# Patient Record
Sex: Male | Born: 1985 | Race: Asian | Hispanic: No | Marital: Single | Smoking: Current some day smoker
Health system: Southern US, Community
[De-identification: ages and names within clinical notes are randomized; demographics above are authoritative.]

## PROBLEM LIST (undated history)

## (undated) DIAGNOSIS — S43006A Unspecified dislocation of unspecified shoulder joint, initial encounter: Secondary | ICD-10-CM

---

## 2010-07-05 ENCOUNTER — Emergency Department (HOSPITAL_COMMUNITY)
Admission: EM | Admit: 2010-07-05 | Discharge: 2010-07-05 | Disposition: A | Discharge status: Home / Self Care | Payer: Self-pay | Attending: Emergency Medicine | Admitting: Emergency Medicine

## 2010-07-05 DIAGNOSIS — R51 Headache: Secondary | ICD-10-CM | POA: Insufficient documentation

## 2010-07-05 DIAGNOSIS — F17200 Nicotine dependence, unspecified, uncomplicated: Secondary | ICD-10-CM | POA: Insufficient documentation

## 2011-06-09 ENCOUNTER — Encounter: Payer: Self-pay | Admitting: Internal Medicine

## 2011-06-09 ENCOUNTER — Ambulatory Visit (INDEPENDENT_AMBULATORY_CARE_PROVIDER_SITE_OTHER): Payer: Self-pay | Admitting: Internal Medicine

## 2011-06-09 DIAGNOSIS — M25519 Pain in unspecified shoulder: Secondary | ICD-10-CM

## 2011-06-09 DIAGNOSIS — M25561 Pain in right knee: Secondary | ICD-10-CM | POA: Insufficient documentation

## 2011-06-09 DIAGNOSIS — M25569 Pain in unspecified knee: Secondary | ICD-10-CM

## 2011-06-09 DIAGNOSIS — M25511 Pain in right shoulder: Secondary | ICD-10-CM

## 2011-06-09 HISTORY — PX: NO PAST SURGERIES: SHX2092

## 2011-06-09 MED ORDER — MELOXICAM 7.5 MG PO TABS: ORAL_TABLET | ORAL | Status: AC
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
## 2011-06-09 NOTE — Assessment & Plan Note (Signed)
?  h/o mild dislocation now with FROM. Attempt mobic prn with food and no other nsaids. Encouraged orthopedic follow up as scheduled.

## 2011-06-09 NOTE — Assessment & Plan Note (Signed)
Resolved. Reportedly nl radiograph

## 2011-06-13 ENCOUNTER — Telehealth: Payer: Self-pay | Admitting: *Deleted

## 2011-06-13 NOTE — Telephone Encounter (Signed)
Patients mother called and left voice message stating patient is scheduled for Shoulder therapy appointment at 3 pm today and she is planning on cancelling appointment. Her message also states patient was given rx for his shoulder, and his shoulder is still inflamed.  Call was returned to patient. I spoke with patients mother, who stated she cancelled his PT appointment today, because he was given a rx for an inflamed shoulder.  Patients mother was informed the medication was short term and patient should follow through with physical therapy. She stated that he will keep the appointment scheduled for Thursday.

## 2012-10-06 ENCOUNTER — Emergency Department (HOSPITAL_COMMUNITY): Payer: Self-pay

## 2012-10-06 ENCOUNTER — Encounter (HOSPITAL_BASED_OUTPATIENT_CLINIC_OR_DEPARTMENT_OTHER): Payer: Self-pay | Admitting: *Deleted

## 2012-10-06 ENCOUNTER — Emergency Department (HOSPITAL_BASED_OUTPATIENT_CLINIC_OR_DEPARTMENT_OTHER): Payer: Self-pay

## 2012-10-06 ENCOUNTER — Emergency Department (HOSPITAL_BASED_OUTPATIENT_CLINIC_OR_DEPARTMENT_OTHER)
Admission: EM | Admit: 2012-10-06 | Discharge: 2012-10-06 | Disposition: A | Discharge status: Home / Self Care | Payer: Self-pay | Attending: Orthopedic Surgery | Admitting: Orthopedic Surgery

## 2012-10-06 DIAGNOSIS — Y92009 Unspecified place in unspecified non-institutional (private) residence as the place of occurrence of the external cause: Secondary | ICD-10-CM | POA: Insufficient documentation

## 2012-10-06 DIAGNOSIS — Y9389 Activity, other specified: Secondary | ICD-10-CM | POA: Insufficient documentation

## 2012-10-06 DIAGNOSIS — S43006A Unspecified dislocation of unspecified shoulder joint, initial encounter: Secondary | ICD-10-CM | POA: Insufficient documentation

## 2012-10-06 DIAGNOSIS — F17200 Nicotine dependence, unspecified, uncomplicated: Secondary | ICD-10-CM | POA: Insufficient documentation

## 2012-10-06 DIAGNOSIS — IMO0002 Reserved for concepts with insufficient information to code with codable children: Secondary | ICD-10-CM | POA: Insufficient documentation

## 2012-10-06 DIAGNOSIS — S43004A Unspecified dislocation of right shoulder joint, initial encounter: Secondary | ICD-10-CM

## 2012-10-06 HISTORY — DX: Unspecified dislocation of unspecified shoulder joint, initial encounter: S43.006A

## 2012-10-06 MED ORDER — PROPOFOL 10 MG/ML IV BOLUS
0.5000 mg/kg | Freq: Once | INTRAVENOUS | Status: AC
  Administered 2012-10-06: 40 mg via INTRAVENOUS

## 2012-10-06 MED ORDER — ONDANSETRON HCL 4 MG/2ML IJ SOLN
INTRAMUSCULAR | Status: AC
  Administered 2012-10-06: 4 mg
  Filled 2012-10-06: qty 2

## 2012-10-06 MED ORDER — HYDROCODONE-ACETAMINOPHEN 5-325 MG PO TABS: 1.0000 | ORAL_TABLET | Freq: Four times a day (QID) | ORAL | Status: AC | PRN

## 2012-10-06 MED ORDER — LORAZEPAM 2 MG/ML IJ SOLN
INTRAMUSCULAR | Status: AC
  Administered 2012-10-06: 1 mg
  Filled 2012-10-06: qty 1

## 2012-10-06 MED ORDER — PROPOFOL 10 MG/ML IV BOLUS
0.5000 mg/kg | Freq: Once | INTRAVENOUS | Status: AC
  Administered 2012-10-06: 70 mg via INTRAVENOUS

## 2012-10-06 MED ORDER — FENTANYL CITRATE 0.05 MG/ML IJ SOLN
INTRAMUSCULAR | Status: AC
  Administered 2012-10-06: 200 ug
  Filled 2012-10-06: qty 4

## 2012-10-06 MED ORDER — FENTANYL CITRATE 0.05 MG/ML IJ SOLN
INTRAMUSCULAR | Status: AC
  Administered 2012-10-06: 100 ug
  Filled 2012-10-06: qty 2

## 2012-10-06 MED ORDER — PROPOFOL 10 MG/ML IV BOLUS
INTRAVENOUS | Status: AC
  Filled 2012-10-06: qty 20

## 2012-10-06 MED ORDER — PROPOFOL 10 MG/ML IV BOLUS
INTRAVENOUS | Status: AC
  Administered 2012-10-06: 40 mg via INTRAVENOUS
  Filled 2012-10-06: qty 20

## 2012-10-06 MED ORDER — MORPHINE SULFATE 4 MG/ML IJ SOLN
4.0000 mg | Freq: Once | INTRAMUSCULAR | Status: AC
  Administered 2012-10-06: 4 mg via INTRAVENOUS
  Filled 2012-10-06: qty 1

## 2012-10-06 NOTE — ED Notes (Signed)
Pt c/o right shoulder pain and deformity, pt has hx os shoulder dislocation in the past.

## 2012-10-06 NOTE — ED Notes (Signed)
Dr. Ave Filter at bedside. Right shoulder reduced without difficulty. Tolerated well. NAD. VSS

## 2012-10-06 NOTE — ED Notes (Signed)
Consent obtained for conscious sedation, pt and family agree with plan of care.

## 2012-10-06 NOTE — ED Notes (Signed)
Dr. Ave Filter made aware pt arrived to [facility], in 9/10 pain. EDP made aware as well.

## 2012-10-06 NOTE — ED Notes (Signed)
Report received, assumed care.  

## 2012-10-06 NOTE — ED Notes (Signed)
Dr Nicanor Alcon at bs for shoulder reduction.

## 2012-10-06 NOTE — ED Notes (Signed)
Pt remains on NRB at this time, MD at bs, resps even and unlabored.

## 2012-10-06 NOTE — ED Provider Notes (Signed)
History     CSN: [id number]  Arrival date & time 10/06/12  0214   First MD Initiated Contact with Patient 10/06/12 0216      Chief Complaint  Patient presents with  . Shoulder Injury    (Consider location/radiation/quality/duration/timing/severity/associated sxs/prior treatment) Patient is a 27 y.o. male presenting with shoulder injury. The history is provided by the patient and a relative.  Shoulder Injury Pertinent negatives include no chest pain, no abdominal pain, no headaches and no shortness of breath.  pt getting into bed last pm, noted sudden onset pain/deformity to right shoulder. Hx same?prior dislocation. Constant. Dull, moderate-severe pain to shoulder. Denies other injury. No numbness/weakness. No neck or back pain. Went to [facility] ED, MD there unable to reduce shoulder - pt sent to [facility] ED for ortho to see/reduce - Dr Ave Filter consulted by [facility] ED provider.    Past Medical History  Diagnosis Date  . Shoulder dislocation     Past Surgical History  Procedure Laterality Date  . No past surgeries  06/09/2011    Family History  Problem Relation Age of Onset  . Heart disease Neg Hx   . Diabetes Neg Hx   . Hypertension Mother   . Prostate cancer Neg Hx   . Breast cancer Neg Hx     History  Substance Use Topics  . Smoking status: Current Some Day Smoker  . Smokeless tobacco: Never Used  . Alcohol Use: Yes      Review of Systems  Constitutional: Negative for fever.  HENT: Negative for neck pain.   Eyes: Negative for pain.  Respiratory: Negative for shortness of breath.   Cardiovascular: Negative for chest pain.  Gastrointestinal: Negative for abdominal pain.  Genitourinary: Negative for flank pain.  Musculoskeletal: Negative for back pain.  Skin: Negative for wound.  Neurological: Negative for weakness, numbness and headaches.  Hematological: Does not bruise/bleed easily.  Psychiatric/Behavioral: The patient is not nervous/anxious.     Allergies   Review of patient's allergies indicates no known allergies.  Home Medications   Current Outpatient Rx  Name  Route  Sig  Dispense  Refill  . acetaminophen (TYLENOL) 325 MG tablet   Oral   Take 650 mg by mouth every 6 (six) hours as needed for pain.           BP 120/69  Pulse 86  Temp(Src) 98.6 F (37 C) (Oral)  Resp 17  SpO2 99%  Physical Exam  Nursing note and vitals reviewed. Constitutional: He appears well-developed and well-nourished. No distress.  HENT:  Head: Atraumatic.  Eyes: Pupils are equal, round, and reactive to light.  Neck: Normal range of motion. Neck supple. No tracheal deviation present.  Cardiovascular: Normal rate.   Pulmonary/Chest: Effort normal and breath sounds normal. No accessory muscle usage. No respiratory distress. He exhibits no tenderness.  Abdominal: Soft. Bowel sounds are normal. He exhibits no distension. There is no tenderness.  Musculoskeletal: Normal range of motion.  CTLS spine, non tender, aligned, no step off. Right shoulder deformity, c/w disloc right shoulder. Distal pulses palp. No other focal bony tenderness  Neurological: He is alert.  Motor intact bil.   Skin: Skin is warm and dry.  Psychiatric: He has a normal mood and affect.    ED Course  Procedures (including critical care time)     MDM  Iv ns.   Informed consent by pt/family re procedural sedation and reduction shoulder dislocation.  Preprocedure  Pre-anesthesia/induction confirmation of laterality/correct procedure site including "time-out."  Provider confirms review of the nurses' note, allergies, medications, pertinent labs, PMH, pre-induction vital signs, pulse oximetry, pain level, and ECG (as applicable), and patient condition satisfactory for commencing with order for sedation and procedure.  Procedural sedation Performed by: Suzi Roots Consent: Verbal consent obtained. Risks and benefits: risks, benefits and alternatives were discussed Required  items: required blood products, implants, devices, and special equipment available Patient identity confirmed: arm band and provided demographic data Time out: Immediately prior to procedure a "time out" was called to verify the correct patient, procedure, equipment, support staff and site/side marked as required.  Sedation type: moderate (conscious) sedation NPO time confirmed and considedered  Sedatives: PROPOFOL  Physician Time at Bedside: 20 min  Vitals: Vital signs were monitored during sedation. Cardiac Monitor, pulse oximeter Patient tolerance: Patient tolerated the procedure well with no immediate complications. Comments: Pt with uneventful recovered. Returned to pre-procedural sedation baseline   Total of 80 mg propofol used, sedation excellent. Pt pulse ox maintained > 98% throughout procedure/recovery. Pt regained full/normal level of alertness post procedure.   Shoulder dislocation reduced by orthopedist, Dr Ave Filter.  Post reduction films reviewed.  Recheck radial pulse 2+. Pt comfortable.  No numbness/weakness. R/m/u n fxn intact.   Spine nt on recheck.  No new c/o.  Pt comfortable. Appears stable for d/c.         Suzi Roots, MD 10/06/12 [phone]

## 2012-10-06 NOTE — ED Notes (Signed)
MD at bedside, reason for possible transfer explained to family and agrees with plan of care.

## 2012-10-06 NOTE — ED Notes (Signed)
PCXR at bedside.

## 2012-10-06 NOTE — ED Notes (Signed)
Attempt x 2 by Dr Nicanor Alcon for shoulder reduction without success.
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
## 2012-10-06 NOTE — ED Notes (Signed)
Pt placed in arm sling and positioned for comfort for transfer. Pt and family aware of plan of care. IV site unremarkable.

## 2012-10-06 NOTE — ED Notes (Signed)
MD at bedside. 

## 2012-10-06 NOTE — ED Notes (Signed)
MD, Dr. Ave Filter at bedside.

## 2012-10-06 NOTE — ED Notes (Signed)
Patient transported to X-ray 

## 2012-10-06 NOTE — ED Notes (Signed)
Pt report given to Franky Macho, Charity fundraiser with [facility].

## 2012-10-06 NOTE — ED Provider Notes (Signed)
History     CSN: [id number]  Arrival date & time 10/06/12  0214   First MD Initiated Contact with Patient 10/06/12 0216      Chief Complaint  Patient presents with  . Shoulder Injury    (Consider location/radiation/quality/duration/timing/severity/associated sxs/prior treatment) Patient is a 27 y.o. male presenting with shoulder injury. The history is provided by the patient and the EMS personnel. No language interpreter was used.  Shoulder Injury This is a recurrent problem. The current episode started 6 to 12 hours ago. The problem occurs constantly. The problem has not changed since onset.Pertinent negatives include no chest pain, no abdominal pain, no headaches and no shortness of breath. Nothing aggravates the symptoms. Nothing relieves the symptoms. He has tried nothing for the symptoms. The treatment provided no relief.    Past Medical History  Diagnosis Date  . Shoulder dislocation     Past Surgical History  Procedure Laterality Date  . No past surgeries  06/09/2011    Family History  Problem Relation Age of Onset  . Heart disease Neg Hx   . Diabetes Neg Hx   . Hypertension Mother   . Prostate cancer Neg Hx   . Breast cancer Neg Hx     History  Substance Use Topics  . Smoking status: Current Some Day Smoker  . Smokeless tobacco: Never Used  . Alcohol Use: Yes      Review of Systems  Respiratory: Negative for shortness of breath.   Cardiovascular: Negative for chest pain.  Gastrointestinal: Negative for abdominal pain.  Neurological: Negative for headaches.  All other systems reviewed and are negative.    Allergies  Review of patient's allergies indicates not on file.  Home Medications  No current outpatient prescriptions on file.  BP 125/82  Pulse 70  Temp(Src) 98.1 F (36.7 C) (Oral)  Resp 15  SpO2 100%  Physical Exam  Constitutional: He is oriented to person, place, and time. He appears well-developed and well-nourished. No distress.   HENT:  Head: Normocephalic and atraumatic.  Mouth/Throat: Oropharynx is clear and moist.  Eyes: Conjunctivae are normal. Pupils are equal, round, and reactive to light.  Neck: Normal range of motion. Neck supple.  Cardiovascular: Normal rate, regular rhythm and intact distal pulses.   Pulmonary/Chest: Effort normal and breath sounds normal. No respiratory distress. He has no wheezes. He has no rales.  Abdominal: Soft. Bowel sounds are normal. There is no tenderness. There is no rebound and no guarding.  Musculoskeletal: He exhibits tenderness.  Right shoulder anterior inferior dislocation  Neurological: He is alert and oriented to person, place, and time. He has normal reflexes.  Skin: Skin is warm and dry.  Psychiatric: He has a normal mood and affect.    ED Course  Procedural sedation Date/Time: 10/06/2012 4:20 AM Performed by: Jasmine Awe Authorized by: Jasmine Awe Consent: Verbal consent obtained. written consent obtained. Risks and benefits: risks, benefits and alternatives were discussed Consent given by: patient Patient understanding: patient states understanding of the procedure being performed Patient identity confirmed: arm band Local anesthesia used: no Patient sedated: yes Sedation type: moderate (conscious) sedation Sedatives: propofol Analgesia: fentanyl Vitals: Vital signs were monitored during sedation. Patient tolerance: Patient tolerated the procedure well with no immediate complications. Comments: Shoulder reduction with traction counter traction scapular manipulation and biceps massage attempted without successful reduction.  Respiratory present during entirety of procedure   (including critical care time)   Labs Reviewed - No data to display Dg Shoulder Right  10/06/2012  *RADIOLOGY REPORT*  Clinical Data: Injury, right shoulder pain.  RIGHT SHOULDER - 2+ VIEW  Comparison: None.  Findings: The right humerus is anteriorly dislocated.   No fracture is identified.  The acromioclavicular joint is intact.  Imaged right lung and ribs are unremarkable.  IMPRESSION: Anterior right shoulder dislocation.   Original Report Authenticated By: Holley Dexter, M.D.      No diagnosis found.    MDM  Potential medication side effects were discussed with the patient; let me know if any occur. Dr. Ave Filter to accept patient in transfer    Belton Peplinski Smitty Cords, MD 10/06/12 [phone]

## 2012-10-06 NOTE — ED Notes (Signed)
Pt placed in arm sling and positioned for comfort per MD order.

## 2012-10-06 NOTE — ED Notes (Signed)
Pt report called and given to Thayer Ohm, Forensic scientist at [facility] ED.

## 2012-10-06 NOTE — ED Notes (Signed)
SR on monitor, resps even and unlabored, IV site unremarkable.

## 2012-10-06 NOTE — ED Notes (Signed)
MD at bedside attempting closed reduction of right shoulder

## 2012-10-06 NOTE — ED Notes (Signed)
Shoulder reduction unsuccessful at this time, pt tolerating procedure with some resistance. Family at bs.

## 2012-10-06 NOTE — ED Notes (Signed)
High flow O2 removed by RT, resps even and unlabored. Pt tolerating well. PO 100% on room air. Family remains at bs. SR up x2. +PMS to all extremities, right arm sling remains in place per MD order, pt tolerating well.

## 2012-10-06 NOTE — ED Notes (Signed)
[facility] called to transport to [place] 

## 2012-10-06 NOTE — ED Notes (Signed)
Vital signs stable. Awaiting portable X ray

## 2012-10-06 NOTE — ED Notes (Signed)
Per EMS they were called to pt's address 2 times tonight for shoulder issue. Pt refused medical tx at first visit and then agreed to come second time. Pt was irate and verbally abusive to EMS staff. Pt calm upon arrival to ED after receiving 100 mcg fentanyl, shoulder sling in place upon arrival, +PMS.

## 2012-10-06 NOTE — ED Notes (Signed)
I notified nurse Helmut Muster of oral temp of 96.6 Then I covered patient with warm blanket,  who was sitting upright  with no shirt.

## 2012-11-16 ENCOUNTER — Other Ambulatory Visit (HOSPITAL_COMMUNITY): Payer: Self-pay | Admitting: Orthopedic Surgery

## 2012-11-16 DIAGNOSIS — R52 Pain, unspecified: Secondary | ICD-10-CM

## 2012-11-16 DIAGNOSIS — M25611 Stiffness of right shoulder, not elsewhere classified: Secondary | ICD-10-CM

## 2012-11-16 DIAGNOSIS — R609 Edema, unspecified: Secondary | ICD-10-CM

## 2012-11-21 ENCOUNTER — Ambulatory Visit (HOSPITAL_COMMUNITY)
Admission: RE | Admit: 2012-11-21 | Discharge: 2012-11-21 | Disposition: A | Discharge status: Home / Self Care | Payer: Self-pay | Source: Ambulatory Visit | Attending: Orthopedic Surgery | Admitting: Orthopedic Surgery

## 2012-11-21 DIAGNOSIS — M25519 Pain in unspecified shoulder: Secondary | ICD-10-CM | POA: Insufficient documentation

## 2012-11-21 DIAGNOSIS — M25611 Stiffness of right shoulder, not elsewhere classified: Secondary | ICD-10-CM

## 2012-11-21 DIAGNOSIS — M899 Disorder of bone, unspecified: Secondary | ICD-10-CM | POA: Insufficient documentation

## 2012-11-21 DIAGNOSIS — R609 Edema, unspecified: Secondary | ICD-10-CM

## 2012-11-21 DIAGNOSIS — M24419 Recurrent dislocation, unspecified shoulder: Secondary | ICD-10-CM | POA: Insufficient documentation

## 2012-11-21 DIAGNOSIS — R52 Pain, unspecified: Secondary | ICD-10-CM

## 2012-11-21 MED ORDER — IOHEXOL 180 MG/ML  SOLN
10.0000 mL | Freq: Once | INTRAMUSCULAR | Status: AC | PRN
  Administered 2012-11-21: 10 mL via INTRAVENOUS

## 2012-11-21 MED ORDER — GADOBENATE DIMEGLUMINE 529 MG/ML IV SOLN
2.0000 mL | Freq: Once | INTRAVENOUS | Status: AC | PRN
  Administered 2012-11-21: 0.1 mL via INTRAVENOUS

## 2012-11-21 MED ORDER — GADOBENATE DIMEGLUMINE 529 MG/ML IV SOLN: 0.1000 mL | Freq: Once | INTRAVENOUS | Status: DC | PRN

## 2014-07-11 IMAGING — CR DG SHOULDER 2+V*R*
3 series · 3 of 3 positions shown · non-contrast
Comparison: None.

CLINICAL DATA: Injury, right shoulder pain.

RIGHT SHOULDER - 2+ VIEW

[w shoulder ap internal righ]
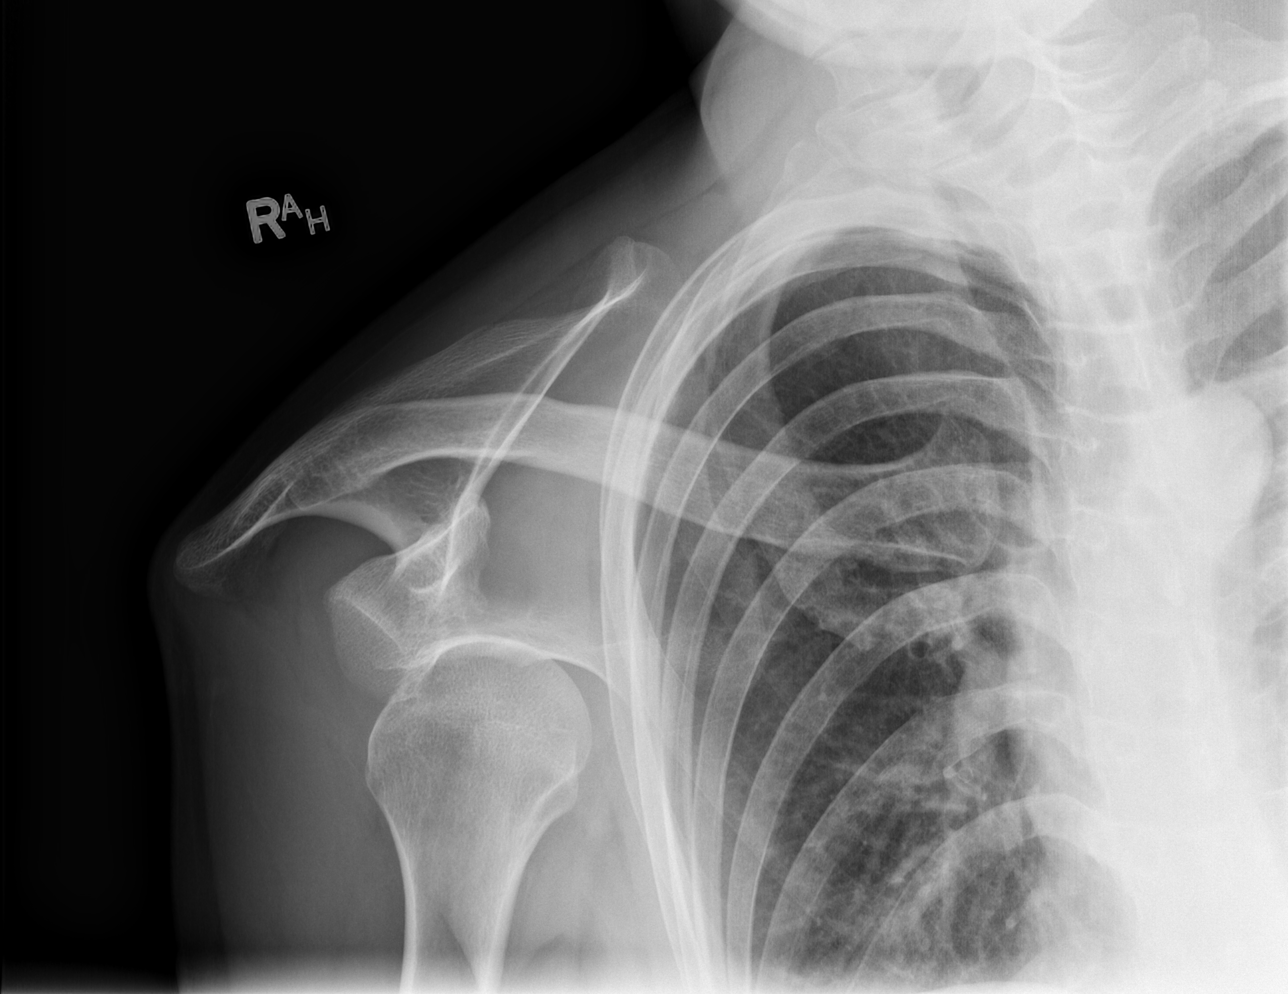

[w shoulder ap external righ]
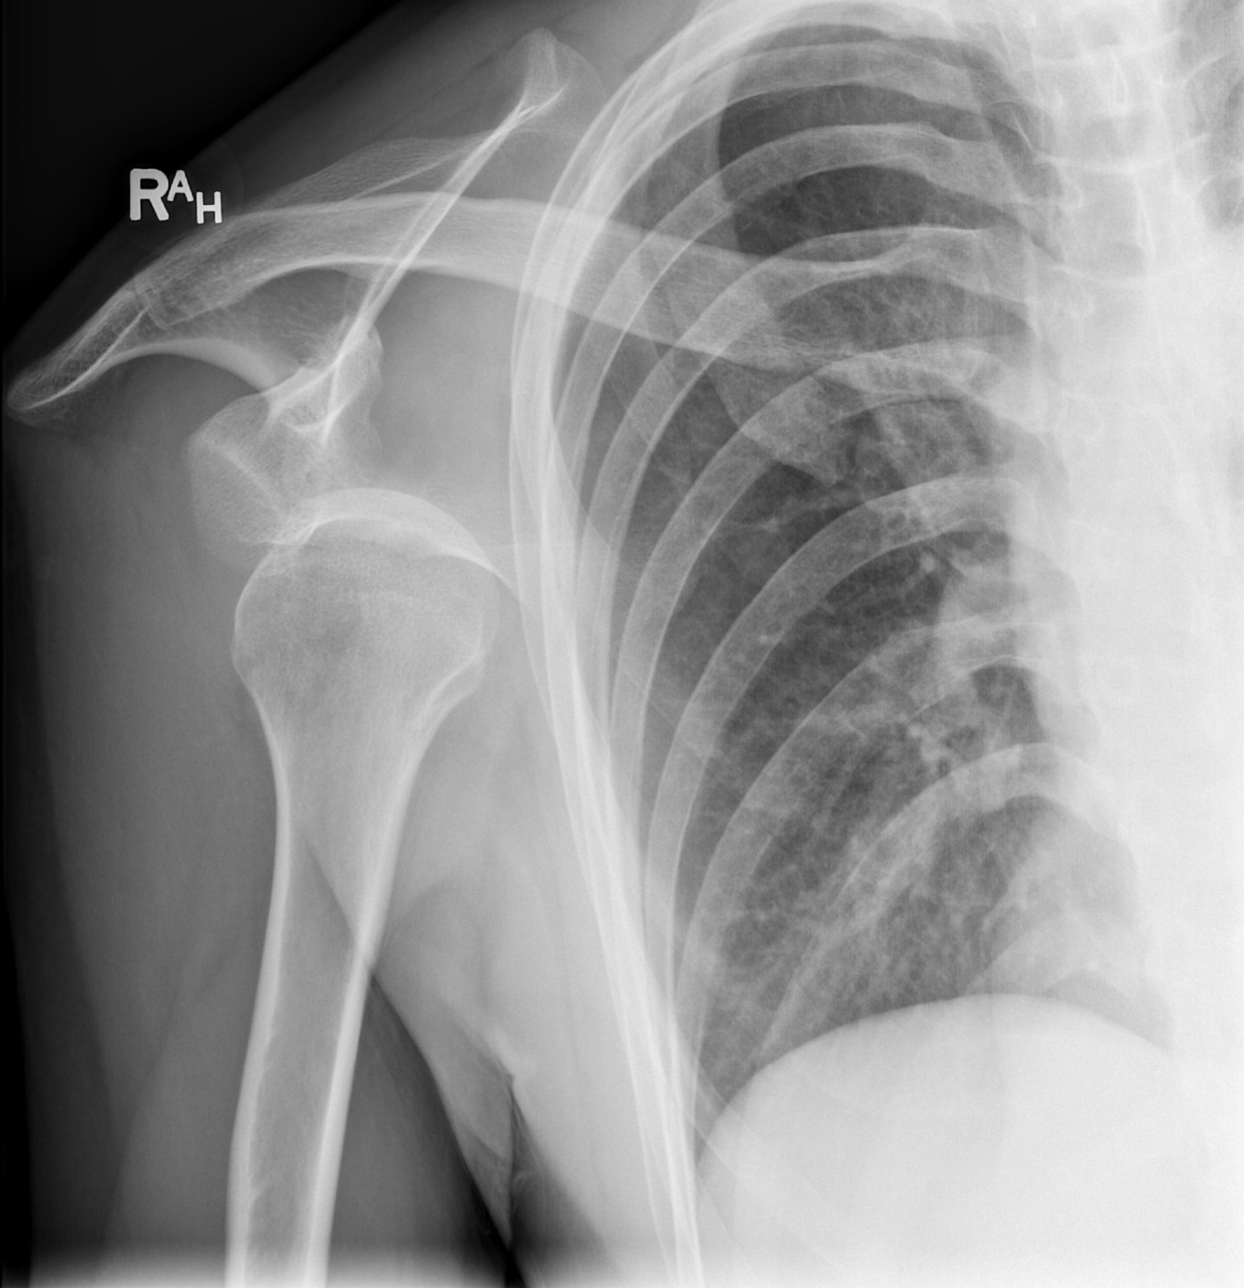

[w shoulder y view right]
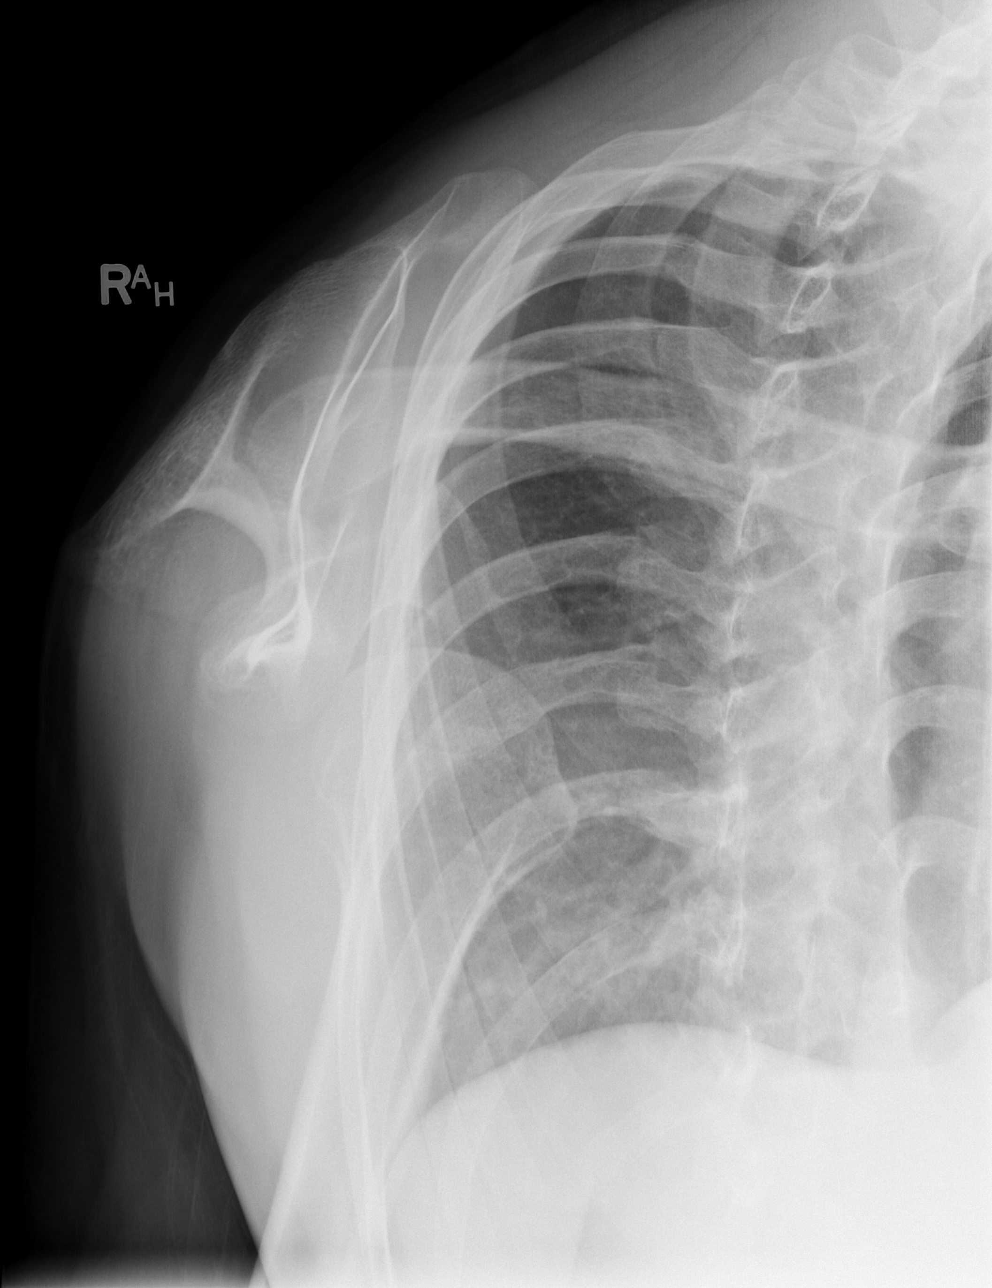

[3 of 3 positions shown; findings below may reference images not displayed]

FINDINGS: The right humerus is anteriorly dislocated.  No fracture
is identified.  The acromioclavicular joint is intact.  Imaged
right lung and ribs are unremarkable.
IMPRESSION: Anterior right shoulder dislocation.

## 2015-04-22 ENCOUNTER — Ambulatory Visit: Payer: Self-pay | Attending: Family Medicine

## 2015-04-22 ENCOUNTER — Encounter (HOSPITAL_BASED_OUTPATIENT_CLINIC_OR_DEPARTMENT_OTHER): Payer: Self-pay | Admitting: Clinical

## 2015-04-22 DIAGNOSIS — Z659 Problem related to unspecified psychosocial circumstances: Secondary | ICD-10-CM

## 2015-04-22 NOTE — Progress Notes (Signed)
ASSESSMENT: Pt currently experiencing problem related to psychosocial circumstances (financial instability, and needing community resources/healthcare navigation). Pt needs to continue [facility] with psychiatrist, establish care with PCP, [facility], and would benefit from supportive counseling and community resources.  Stage of Change: precontemplative  PLAN: 1. F/U with behavioral health consultant in as needed 2. Psychiatric Medications: (pt cannot recall name of medication, for schizophrenia). 3. Behavioral recommendation(s):   -Go to [employer] office to apply for disability -Continue med [facility] SUBJECTIVE: Pt. referred by father for community resources:  Pt. reports the following symptoms/concerns: Pt states that [place] is going to [facility], where he is being treated for schizophrenia with medication; pt's father says that he wants to help his son either go to work or find out about any financial assistance for his son. Pt denies any symptoms of anxiety or depression, his father says son was "saying things that didn't make sense" prior to [facility], but that his son is doing much better now, and stable.  Duration of problem: undetermined amount of time Severity: severe  OBJECTIVE: Orientation & Cognition: Oriented x3. Thought processes normal and appropriate to situation. Mood: appropriate. Affect: flat Appearance: appropriate Risk of harm to self or others: no known risk of harm to self or others Substance use: alcohol, tobacco Assessments administered: none  Diagnosis: Problem related to psychosocial circumstances CPT Code: Z65.9 -------------------------------------------- Other(s) present in the room: father  Time spent with patient in exam room: 16 minutes
